# Patient Record
Sex: Male | Born: 1945 | Race: White | Hispanic: No | Marital: Married | Smoking: Never smoker
Health system: Southern US, Community
[De-identification: ages and names within clinical notes are randomized; demographics above are authoritative.]

## PROBLEM LIST (undated history)

## (undated) ENCOUNTER — Emergency Department (HOSPITAL_COMMUNITY): Admission: EM

## (undated) DIAGNOSIS — I639 Cerebral infarction, unspecified: Secondary | ICD-10-CM

## (undated) DIAGNOSIS — Z96659 Presence of unspecified artificial knee joint: Secondary | ICD-10-CM

## (undated) HISTORY — DX: Cerebral infarction, unspecified: I63.9

---

## 2011-11-26 ENCOUNTER — Other Ambulatory Visit (HOSPITAL_COMMUNITY): Payer: Self-pay | Admitting: Orthopedic Surgery

## 2011-11-26 DIAGNOSIS — M25562 Pain in left knee: Secondary | ICD-10-CM

## 2011-12-03 ENCOUNTER — Encounter (HOSPITAL_COMMUNITY): Payer: Self-pay

## 2011-12-03 ENCOUNTER — Encounter (HOSPITAL_COMMUNITY)
Admission: RE | Admit: 2011-12-03 | Discharge: 2011-12-03 | Disposition: A | Discharge status: Home / Self Care | Source: Ambulatory Visit | Attending: Orthopedic Surgery | Admitting: Orthopedic Surgery

## 2011-12-03 DIAGNOSIS — M25569 Pain in unspecified knee: Secondary | ICD-10-CM | POA: Insufficient documentation

## 2011-12-03 DIAGNOSIS — Y831 Surgical operation with implant of artificial internal device as the cause of abnormal reaction of the patient, or of later complication, without mention of misadventure at the time of the procedure: Secondary | ICD-10-CM | POA: Insufficient documentation

## 2011-12-03 DIAGNOSIS — T8489XA Other specified complication of internal orthopedic prosthetic devices, implants and grafts, initial encounter: Secondary | ICD-10-CM | POA: Insufficient documentation

## 2011-12-03 DIAGNOSIS — Z96659 Presence of unspecified artificial knee joint: Secondary | ICD-10-CM | POA: Insufficient documentation

## 2011-12-03 DIAGNOSIS — M25562 Pain in left knee: Secondary | ICD-10-CM

## 2011-12-03 HISTORY — DX: Presence of unspecified artificial knee joint: Z96.659

## 2011-12-03 MED ORDER — TECHNETIUM TC 99M MEDRONATE IV KIT
23.5000 | PACK | Freq: Once | INTRAVENOUS | Status: AC | PRN
  Administered 2011-12-03: 23.5 via INTRAVENOUS

## 2022-08-07 ENCOUNTER — Other Ambulatory Visit: Payer: Self-pay

## 2022-08-07 ENCOUNTER — Emergency Department (HOSPITAL_COMMUNITY): Payer: Medicare Other

## 2022-08-07 ENCOUNTER — Emergency Department (HOSPITAL_COMMUNITY)
Admission: EM | Admit: 2022-08-07 | Discharge: 2022-08-07 | Disposition: A | Discharge status: Home / Self Care | Payer: Medicare Other | Attending: Emergency Medicine | Admitting: Emergency Medicine

## 2022-08-07 ENCOUNTER — Encounter (HOSPITAL_COMMUNITY): Payer: Self-pay

## 2022-08-07 DIAGNOSIS — R200 Anesthesia of skin: Secondary | ICD-10-CM

## 2022-08-07 DIAGNOSIS — Y900 Blood alcohol level of less than 20 mg/100 ml: Secondary | ICD-10-CM | POA: Diagnosis not present

## 2022-08-07 DIAGNOSIS — Z96659 Presence of unspecified artificial knee joint: Secondary | ICD-10-CM | POA: Diagnosis not present

## 2022-08-07 DIAGNOSIS — R531 Weakness: Secondary | ICD-10-CM | POA: Diagnosis not present

## 2022-08-07 DIAGNOSIS — R42 Dizziness and giddiness: Secondary | ICD-10-CM | POA: Diagnosis not present

## 2022-08-07 DIAGNOSIS — R7989 Other specified abnormal findings of blood chemistry: Secondary | ICD-10-CM | POA: Diagnosis not present

## 2022-08-07 DIAGNOSIS — R202 Paresthesia of skin: Secondary | ICD-10-CM | POA: Insufficient documentation

## 2022-08-07 DIAGNOSIS — I10 Essential (primary) hypertension: Secondary | ICD-10-CM | POA: Diagnosis not present

## 2022-08-07 LAB — CBC
HCT: 43.4 % (ref 39.0–52.0)
Hemoglobin: 14.9 g/dL (ref 13.0–17.0)
MCH: 30 pg (ref 26.0–34.0)
MCHC: 34.3 g/dL (ref 30.0–36.0)
MCV: 87.3 fL (ref 80.0–100.0)
Platelets: 204 10*3/uL (ref 150–400)
RBC: 4.97 MIL/uL (ref 4.22–5.81)
RDW: 13.9 % (ref 11.5–15.5)
WBC: 12.8 10*3/uL — ABNORMAL HIGH (ref 4.0–10.5)
nRBC: 0 % (ref 0.0–0.2)

## 2022-08-07 LAB — COMPREHENSIVE METABOLIC PANEL
ALT: 22 U/L (ref 0–44)
AST: 21 U/L (ref 15–41)
Albumin: 3.9 g/dL (ref 3.5–5.0)
Alkaline Phosphatase: 72 U/L (ref 38–126)
Anion gap: 13 (ref 5–15)
BUN: 17 mg/dL (ref 8–23)
CO2: 27 mmol/L (ref 22–32)
Calcium: 9.3 mg/dL (ref 8.9–10.3)
Chloride: 102 mmol/L (ref 98–111)
Creatinine, Ser: 1.53 mg/dL — ABNORMAL HIGH (ref 0.61–1.24)
GFR, Estimated: 48 mL/min — ABNORMAL LOW (ref >60)
Glucose, Bld: 126 mg/dL — ABNORMAL HIGH (ref 70–99)
Potassium: 3.8 mmol/L (ref 3.5–5.1)
Sodium: 142 mmol/L (ref 135–145)
Total Bilirubin: 0.8 mg/dL (ref 0.3–1.2)
Total Protein: 6.6 g/dL (ref 6.5–8.1)

## 2022-08-07 LAB — DIFFERENTIAL
Abs Immature Granulocytes: 0.09 10*3/uL — ABNORMAL HIGH (ref 0.00–0.07)
Basophils Absolute: 0.1 10*3/uL (ref 0.0–0.1)
Basophils Relative: 1 %
Eosinophils Absolute: 0.3 10*3/uL (ref 0.0–0.5)
Eosinophils Relative: 3 %
Immature Granulocytes: 1 %
Lymphocytes Relative: 26 %
Lymphs Abs: 3.3 10*3/uL (ref 0.7–4.0)
Monocytes Absolute: 0.8 10*3/uL (ref 0.1–1.0)
Monocytes Relative: 6 %
Neutro Abs: 8.2 10*3/uL — ABNORMAL HIGH (ref 1.7–7.7)
Neutrophils Relative %: 63 %

## 2022-08-07 LAB — APTT: aPTT: 26 seconds (ref 24–36)

## 2022-08-07 LAB — I-STAT CHEM 8, ED
BUN: 20 mg/dL (ref 8–23)
Calcium, Ion: 1.16 mmol/L (ref 1.15–1.40)
Chloride: 100 mmol/L (ref 98–111)
Creatinine, Ser: 1.5 mg/dL — ABNORMAL HIGH (ref 0.61–1.24)
Glucose, Bld: 118 mg/dL — ABNORMAL HIGH (ref 70–99)
HCT: 45 % (ref 39.0–52.0)
Hemoglobin: 15.3 g/dL (ref 13.0–17.0)
Potassium: 3.8 mmol/L (ref 3.5–5.1)
Sodium: 140 mmol/L (ref 135–145)
TCO2: 26 mmol/L (ref 22–32)

## 2022-08-07 LAB — PROTIME-INR
INR: 1.1 (ref 0.8–1.2)
Prothrombin Time: 13.8 seconds (ref 11.4–15.2)

## 2022-08-07 LAB — CBG MONITORING, ED: Glucose-Capillary: 123 mg/dL — ABNORMAL HIGH (ref 70–99)

## 2022-08-07 LAB — ETHANOL: Alcohol, Ethyl (B): <10 mg/dL (ref <10)

## 2022-08-07 MED ORDER — IOHEXOL 350 MG/ML SOLN
75.0000 mL | Freq: Once | INTRAVENOUS | Status: AC | PRN
  Administered 2022-08-07: 75 mL via INTRAVENOUS

## 2022-08-07 MED ORDER — SODIUM CHLORIDE 0.9% FLUSH
3.0000 mL | Freq: Once | INTRAVENOUS | Status: AC
  Administered 2022-08-07: 3 mL via INTRAVENOUS

## 2022-08-07 NOTE — Code Documentation (Signed)
Stroke Response Nurse Documentation Code Documentation  Evan Castillo is a 76 y.o. male arriving to [facility]  via [facility] on 10/25 with past medical hx of CVA, HTN, HLD. On clopidogrel 75 mg daily. Code stroke was activated by EMS.   Patient from home where he was LKW at 2200 and now complaining of right sided numbness .   Stroke team at the bedside on patient arrival. Labs drawn and patient cleared for CT by Dr. Betsey Holiday. Patient to CT with team. NIHSS 2, see documentation for details and code stroke times. Patient with right hemianopia and right decreased sensation on exam. The following imaging was completed:  CT Head and CTA. Patient is not a candidate for IV Thrombolytic due to outside window. Patient is not a candidate for IR due to No LVO.    Bedside handoff with ED RN Ysidro Evert.    Madelynn Done  Rapid Response RN

## 2022-08-07 NOTE — Progress Notes (Signed)
Brief Neuro Update:  Reviewed MRI brain with read concerning for either subtle punctate ischemic stroke in left frontal lobe vs artifact. I suspect that this is likely an artifact as the noted DWI is not appreciable on coronal DWI and this would not explain the numbness that patient reported involving his entire RUE and RLE.  I would consider this a negative MRI brain. No further inpatient stroke workup. Follow up with neurology outpatient.  [place] Pager Number [phone]

## 2022-08-07 NOTE — Consult Note (Signed)
NEUROLOGY CONSULTATION NOTE   Date of service: August 07, 2022 Patient Name: Evan Castillo MRN:  [id number] DOB:  1946-10-08 Reason for consult: "R sided numbness," Requesting Provider: Gilda Crease, * _ _ _   _ __   _ __ _ _  __ __   _ __   __ _  History of Present Illness  Evan Castillo is a 76 y.o. 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 These symptoms feel similar to his prior stroke, they are worse than his typical baseline but not as bad as his prior stroke in 2021.  He was noted to have elevated BP by EMS to 200s systolic. Patient reports that BP usually runs in 120s systolic at baseline.  CTH without contrast with no ICH, CTA with no LVO.  LKW: 2200 on 08/06/22 mRS: 0 tNKASE: not offered, outside window Thrombectomy: not offered, no LVO NIHSS components Score: Comment  1a Level of Conscious 0[x]  1[]  2[]  3[]      1b LOC Questions 0[x]  1[]  2[]       1c LOC Commands 0[x]  1[]  2[]       2 Best Gaze 0[x]  1[]  2[]       3 Visual 0[]  1[x]  2[]  3[]      4 Facial Palsy 0[x]  1[]  2[]  3[]      5a Motor Arm - left 0[x]  1[]  2[]  3[]  4[]  UN[]    5b Motor Arm - Right 0[x]  1[]  2[]  3[]  4[]  UN[]    6a Motor Leg - Left 0[x]  1[]  2[]  3[]  4[]  UN[]    6b Motor Leg - Right 0[x]  1[]  2[]  3[]  4[]  UN[]    7 Limb Ataxia 0[x]  1[]  2[]  3[]  UN[]     8 Sensory 0[]  1[x]  2[]  UN[]      9 Best Language 0[x]  1[]  2[]  3[]      10 Dysarthria 0[x]  1[]  2[]  UN[]      11 Extinct. and Inattention 0[x]  1[]  2[]       TOTAL: 2        ROS   Constitutional Denies weight loss, fever and chills.   HEENT Denies changes in vision and hearing.   Respiratory Denies SOB and cough.   CV Denies palpitations and CP   GI Denies abdominal pain, nausea, vomiting and diarrhea.   GU  Denies dysuria and urinary frequency.   MSK Denies myalgia and joint pain.   Skin Denies rash and pruritus.   Neurological Denies headache and syncope.   Psychiatric Denies recent changes in mood. Denies anxiety and depression.    Past History   Past Medical History:  Diagnosis Date   H/O total knee replacement     No family history on file. Social History   Socioeconomic History   Marital status: Married    Spouse name: Not on file   Number of children: Not on file   Years of education: Not on file   Highest education level: Not on file  Occupational History   Not on file  Tobacco Use   Smoking status: Not on file   Smokeless tobacco: Not on file  Substance and Sexual Activity   Alcohol use: Not on file   Drug use: Not on file   Sexual activity: Not on file  Other Topics Concern   Not  on file  Social History Narrative   Not on file   Social Determinants of Health   Financial Resource Strain: Not on file  Food Insecurity: Not on file  Transportation Needs: Not on file  Physical Activity: Not on file  Stress: Not on file  Social Connections: Not on file   No Known Allergies  Medications  (Not in a hospital admission)    Vitals   There were no vitals filed for this visit.   There is no height or weight on file to calculate BMI.  Physical Exam   General: Laying comfortably in bed; in no acute distress.  HENT: Normal oropharynx and mucosa. Normal external appearance of ears and nose.  Neck: Supple, no pain or tenderness  CV: No JVD. No peripheral edema.  Pulmonary: Symmetric Chest rise. Normal respiratory effort.  Abdomen: Soft to touch, non-tender.  Ext: No cyanosis, edema, or deformity  Skin: No rash. Normal palpation of skin.   Musculoskeletal: Normal digits and nails by inspection. No clubbing.   Neurologic Examination  Mental status/Cognition: Alert, oriented to self, place, month and year, good attention.  Speech/language: Fluent, comprehension  intact, object naming intact, repetition intact.  Cranial nerves:   CN II Pupils equal and reactive to light, inconsistent responses in R superior quadrant.   CN III,IV,VI EOM intact, no gaze preference or deviation, no nystagmus    CN V normal sensation in V1, V2, and V3 segments bilaterally    CN VII no asymmetry, no nasolabial fold flattening    CN VIII normal hearing to speech    CN IX & X normal palatal elevation, no uvular deviation    CN XI 5/5 head turn and 5/5 shoulder shrug bilaterally    CN XII midline tongue protrusion    Motor:  Muscle bulk: normal, tone normal, pronator drift none tremor none Mvmt Root Nerve  Muscle Right Left Comments  SA C5/6 Ax Deltoid 5 5   EF C5/6 Mc Biceps 5 5   EE C6/7/8 Rad Triceps 5 5   WF C6/7 Med FCR     WE C7/8 PIN ECU     F Ab C8/T1 U ADM/FDI 5 5   HF L1/2/3 Fem Illopsoas 5 5   KE L2/3/4 Fem Quad 5 5   DF L4/5 D Peron Tib Ant 5 5   PF S1/2 Tibial Grc/Sol 5 5    Sensation:  Light touch Decreased in RUE and RLE   Pin prick    Temperature    Vibration   Proprioception    Coordination/Complex Motor:  - Finger to Nose intact BL - Heel to shin intact BL - Rapid alternating movement are normal - Gait: Deferred  Labs   CBC:  Recent Labs  Lab 08/07/22 0239 08/07/22 0241  WBC 12.8*  --   NEUTROABS 8.2*  --   HGB 14.9 15.3  HCT 43.4 45.0  MCV 87.3  --   PLT 204  --     Basic Metabolic Panel:  Lab Results  Component Value Date   NA 140 08/07/2022   K 3.8 08/07/2022   GLUCOSE 118 (H) 08/07/2022   BUN 20 08/07/2022   CREATININE 1.50 (H) 08/07/2022   Lipid Panel: No results found for: "LDLCALC" HgbA1c: No results found for: "HGBA1C" Urine Drug Screen: No results found for: "LABOPIA", "COCAINSCRNUR", "LABBENZ", "AMPHETMU", "THCU", "LABBARB"  Alcohol Level No results found for: "ETH"  CT Head without contrast(Personally reviewed): CTH was negative for a large hypodensity concerning for a  large territory infarct or  hyperdensity concerning for an [place]  CT angio Head and Neck with contrast(Personally reviewed): No LVO, multifocal multivessel stenosis.  MRI Brain: pending   Impression   Evan Castillo is a 76 y.o. 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 He is outside the tnkase window and no LVO on CT angio.  Suspect that this is either recurdescence of his prior symptoms specially given the symptoms feel similar to his prior strokes vs new stroke. On exam, he was noted to have inconsistent responses in R superior quadrant that patient is unsure if this is new but had not noticed this in the past.  Recommendations  - MRI brain without contrast. If negative, no further inpatient neurological workup. ______________________________________________________________________   Thank you for the opportunity to take part in the care of this patient. If you have any further questions, please contact the neurology consultation attending.  Signed,  [place] Pager Number [phone] _ _ _   _ __   _ __ _ _  __ __   _ __   __ _

## 2022-08-07 NOTE — ED Provider Notes (Signed)
[facility] EMERGENCY DEPARTMENT Provider Note   CSN: [id number] Arrival date & time: 08/07/22  0234  An emergency department physician performed an initial assessment on this suspected stroke patient at 86.  History  Chief Complaint  Patient presents with   Code Stroke    Evan Castillo is a 76 y.o. male.  Patient presents to the emergency department by ambulance from home.  Patient comes in for stroke symptoms.  Patient reports prior history of stroke with residual sensory deficit on the right side.  Patient reports that he had sudden worsening of the numbness on his right side tonight.  He also has been experiencing some dizziness.  Patient reports a history of dizziness with his prior stroke.       Home Medications Prior to Admission medications   Not on File      Allergies    Chlorhexidine, Codeine, and Testosterone    Review of Systems   Review of Systems  Physical Exam Updated Vital Signs BP 133/61   Pulse 75   Temp 98.2 F (36.8 C) (Oral)   Resp 13   Ht 6' (1.829 m)   Wt 95.3 kg   SpO2 95%   BMI 28.48 kg/m  Physical Exam Vitals and nursing note reviewed.  Constitutional:      General: He is not in acute distress.    Appearance: He is well-developed.  HENT:     Head: Normocephalic and atraumatic.     Mouth/Throat:     Mouth: Mucous membranes are moist.  Eyes:     General: Vision grossly intact. Gaze aligned appropriately.     Extraocular Movements: Extraocular movements intact.     Conjunctiva/sclera: Conjunctivae normal.  Cardiovascular:     Rate and Rhythm: Normal rate and regular rhythm.     Pulses: Normal pulses.     Heart sounds: Normal heart sounds, S1 normal and S2 normal. No murmur heard.    No friction rub. No gallop.  Pulmonary:     Effort: Pulmonary effort is normal. No respiratory distress.     Breath sounds: Normal breath sounds.  Abdominal:     Palpations: Abdomen is soft.     Tenderness: There is no abdominal  tenderness. There is no guarding or rebound.     Hernia: No hernia is present.  Musculoskeletal:        General: No swelling.     Cervical back: Full passive range of motion without pain, normal range of motion and neck supple. No pain with movement, spinous process tenderness or muscular tenderness. Normal range of motion.     Right lower leg: No edema.     Left lower leg: No edema.  Skin:    General: Skin is warm and dry.     Capillary Refill: Capillary refill takes less than 2 seconds.     Findings: No ecchymosis, erythema, lesion or wound.  Neurological:     Mental Status: He is alert and oriented to person, place, and time.     GCS: GCS eye subscore is 4. GCS verbal subscore is 5. GCS motor subscore is 6.     Cranial Nerves: Cranial nerves 2-12 are intact.     Sensory: Sensory deficit present.     Motor: Motor function is intact. No weakness or abnormal muscle tone.     Coordination: Coordination is intact.  Psychiatric:        Mood and Affect: Mood normal.        Speech: Speech  normal.        Behavior: Behavior normal.     ED Results / Procedures / Treatments   Labs (all labs ordered are listed, but only abnormal results are displayed) Labs Reviewed  CBC - Abnormal; Notable for the following components:      Result Value   WBC 12.8 (*)    All other components within normal limits  DIFFERENTIAL - Abnormal; Notable for the following components:   Neutro Abs 8.2 (*)    Abs Immature Granulocytes 0.09 (*)    All other components within normal limits  COMPREHENSIVE METABOLIC PANEL - Abnormal; Notable for the following components:   Glucose, Bld 126 (*)    Creatinine, Ser 1.53 (*)    GFR, Estimated 48 (*)    All other components within normal limits  I-STAT CHEM 8, ED - Abnormal; Notable for the following components:   Creatinine, Ser 1.50 (*)    Glucose, Bld 118 (*)    All other components within normal limits  CBG MONITORING, ED - Abnormal; Notable for the following  components:   Glucose-Capillary 123 (*)    All other components within normal limits  PROTIME-INR  APTT  ETHANOL    EKG None  Radiology MR BRAIN WO CONTRAST  Result Date: 08/07/2022 CLINICAL DATA:  76 year old male code stroke presentation this morning. Previous left medullary infarct. Increased right side numbness. Possible abnormal distal right MCA branches. EXAM: MRI HEAD WITHOUT CONTRAST TECHNIQUE: Multiplanar, multiecho pulse sequences of the brain and surrounding structures were obtained without intravenous contrast. COMPARISON:  CT head and CTA head and neck earlier today. [facility] brain MRI 02/21/2020. FINDINGS: Brain: Questionable subtle abnormal diffusion in the left middle frontal gyrus cortex series 5, images 90 and 92. This is poorly correlated on ADC and coronal DWI. And little if any T2 or FLAIR correlation. No other restricted diffusion or evidence of acute infarction. Expected evolution of left posterolateral medullary infarct since 2021. Elsewhere stable gray and white matter signal since 2021, largely normal for age. No cerebral cortical encephalomalacia or chronic blood products identified. No midline shift, mass effect, evidence of mass lesion, ventriculomegaly, extra-axial collection or acute intracranial hemorrhage. Cervicomedullary junction and pituitary are within normal limits. Vascular: Major intracranial vascular flow voids are stable since 2021. Skull and upper cervical spine: Cervical spine fusion susceptibility artifact. Visualized bone marrow signal is within normal limits. Sinuses/Orbits: Regressed but not resolved paranasal sinus inflammation since 2021. Orbits appear stable and negative. Other: Mastoids remain clear. Visible internal auditory structures appear normal. Negative visible scalp and face. IMPRESSION: 1. Questionable punctate subtle ischemia in the left middle frontal gyrus (series 5 images 90, 92), but this  could be artifact. 2. No other acute intracranial abnormality. Expected evolution of 2021 left lateral medullary infarct. And elsewhere largely normal for age noncontrast MRI appearance of the brain. Electronically Signed   By: Odessa Fleming M.D.   On: 08/07/2022 06:43   CT ANGIO HEAD NECK W WO CM (CODE STROKE)  Result Date: 08/07/2022 CLINICAL DATA:  Stroke suspected EXAM: CT ANGIOGRAPHY HEAD AND NECK TECHNIQUE: Multidetector CT imaging of the head and neck was performed using the standard protocol during bolus administration of intravenous contrast. Multiplanar CT image reconstructions and MIPs were obtained to evaluate the vascular anatomy. Carotid stenosis measurements (when applicable) are obtained utilizing NASCET criteria, using the distal internal carotid diameter as the denominator. RADIATION DOSE REDUCTION: This exam was performed according to the departmental dose-optimization program  which includes automated exposure control, adjustment of the mA and/or kV according to patient size and/or use of iterative reconstruction technique. CONTRAST:  75 mL Omnipaque 350 COMPARISON:  02/19/2020 CTA head neck FINDINGS: CT HEAD FINDINGS For noncontrast findings, please see same day CT head. CTA NECK FINDINGS Aortic arch: Standard branching. Imaged portion shows no evidence of aneurysm or dissection. No significant stenosis of the major arch vessel origins. Aortic atherosclerosis. Right carotid system: No evidence of dissection, occlusion, or hemodynamically significant stenosis (greater than 50%). Atherosclerotic disease at the bifurcation and in the proximal ICA is not hemodynamically significant. Left carotid system: No evidence of dissection, occlusion, or hemodynamically significant stenosis (greater than 50%). Atherosclerotic disease at the bifurcation and in the proximal ICA is not hemodynamically significant. Vertebral arteries: No evidence of dissection, occlusion, or hemodynamically significant stenosis  (greater than 50%). Skeleton: ACDF C4-C6. Osseous fusion across the C6-C7 disc space and C2-C3 facets. Other neck: No acute finding. Upper chest: No acute finding. Review of the MIP images confirms the above findings CTA HEAD FINDINGS Anterior circulation: Both internal carotid arteries are patent to the termini, without significant stenosis. A1 segments patent. Normal anterior communicating artery. Anterior cerebral arteries are patent to their distal aspects. No M1 stenosis or occlusion. Intermittent poor opacification of some posterior right M3 and M4 branches (series 7, images 98-100, 97-99, and 90-92, for example). MCA branches perfused and symmetric. Posterior circulation: Vertebral arteries patent to the vertebrobasilar junction without stenosis. Posterior inferior cerebellar arteries patent proximally. Basilar patent to its distal aspect. Superior cerebellar arteries patent proximally. Patent P1 segments. PCAs perfused to their distal aspects without stenosis. The bilateral posterior communicating arteries are not visualized. Venous sinuses: As permitted by contrast timing, patent. Anatomic variants: None significant. Review of the MIP images confirms the above findings IMPRESSION: 1. Evaluation is somewhat limited by bolus timing. Within this limitation, there is intermittent poor opacification of some posterior right M3 and M4 branches, which are new from the prior exam and concerning for multifocal severe stenosis. 2. No hemodynamically significant stenosis in the neck. 3. Aortic atherosclerosis. Aortic Atherosclerosis (ICD10-I70.0). Code stroke imaging results were communicated on 08/07/2022 at 3:10 am to provider Dr. Derry Lory via secure text paging. Electronically Signed   By: Wiliam Ke M.D.   On: 08/07/2022 03:11   CT HEAD CODE STROKE WO CONTRAST  Result Date: 08/07/2022 CLINICAL DATA:  Code stroke. EXAM: CT HEAD WITHOUT CONTRAST TECHNIQUE: Contiguous axial images were obtained from the base  of the skull through the vertex without intravenous contrast. RADIATION DOSE REDUCTION: This exam was performed according to the departmental dose-optimization program which includes automated exposure control, adjustment of the mA and/or kV according to patient size and/or use of iterative reconstruction technique. COMPARISON:  02/19/2020 FINDINGS: Brain: No evidence of acute infarction, hemorrhage, cerebral edema, mass, mass effect, or midline shift. No hydrocephalus or extra-axial collection. Vascular: No hyperdense vessel. Atherosclerotic calcifications in the intracranial carotid and vertebral arteries. Skull: Negative for fracture or focal lesion. Sinuses/Orbits: Mucosal thickening in the ethmoid air cells and right frontal sinus. The orbits are unremarkable. Other: The mastoid air cells are well aerated. ASPECTS [facility] Stroke Program Early CT Score) - Ganglionic level infarction (caudate, lentiform nuclei, internal capsule, insula, M1-M3 cortex): 7 - Supraganglionic infarction (M4-M6 cortex): 3 Total score (0-10 with 10 being normal): 10 IMPRESSION: 1. No acute intracranial process. 2. ASPECTS is 10. Code stroke imaging results were communicated on 08/07/2022 at 2:52 am to provider Dr. Derry Lory via secure text paging.  Electronically Signed   By: Merilyn Baba M.D.   On: 08/07/2022 02:53    Procedures Procedures    Medications Ordered in ED Medications  sodium chloride flush (NS) 0.9 % injection 3 mL (3 mLs Intravenous Given 08/07/22 0311)  iohexol (OMNIPAQUE) 350 MG/ML injection 75 mL (75 mLs Intravenous Contrast Given 08/07/22 0256)    ED Course/ Medical Decision Making/ A&P                           Medical Decision Making Amount and/or Complexity of Data Reviewed Labs: ordered. Radiology: ordered.   Patient presents to the emergency department for evaluation of increased numbness and tingling on his right side.  Patient does have a history of prior stroke with right-sided residual  symptoms.  Differential diagnosis includes acute CVA, TIA as well as other general medical conditions causing his chronic deficits to be more pronounced.  Patient's work-up has been reassuring.  He was hypertensive during transport but this has self corrected.  Screening CT was negative.  Patient underwent CT angio head and neck which showed areas of multifocal stenosis but no occlusion.  Patient was sent for MRI to further evaluate.  No new stroke is seen.  Work-up was performed as a code stroke in conjunction with Dr. Lorrin Goodell.  Imaging has been discussed with Dr. Lorrin Goodell and he feels the patient can be safely discharged with outpatient follow-up.        Final Clinical Impression(s) / ED Diagnoses Final diagnoses:  Paresthesia    Rx / DC Orders ED Discharge Orders     None         Shauntay Brunelli, Gwenyth Allegra, MD 08/07/22 [phone]

## 2022-08-07 NOTE — ED Notes (Signed)
Ambulatory to restroom

## 2022-08-07 NOTE — ED Triage Notes (Signed)
Arrives EMS from home with c/o gradual weakness / numbness in right side since ~10PM last night. Has preexisting weakness in right side from previous CVA in 2021 but says feeling increased.   Describes it as a tingling sensation like his right side went to sleep. Initial NIH-2.   Alert, oriented x 4.

## 2023-12-23 ENCOUNTER — Other Ambulatory Visit: Payer: Self-pay | Admitting: Medical Genetics

## 2023-12-26 ENCOUNTER — Other Ambulatory Visit

## 2023-12-26 DIAGNOSIS — Z006 Encounter for examination for normal comparison and control in clinical research program: Secondary | ICD-10-CM

## 2024-01-06 LAB — GENECONNECT MOLECULAR SCREEN: Genetic Analysis Overall Interpretation: NEGATIVE
